# Patient Record
Sex: Female | Born: 2000 | Race: Black or African American | Hispanic: No | Marital: Single | Smoking: Current some day smoker
Health system: Southern US, Community
[De-identification: ages and names within clinical notes are randomized; demographics above are authoritative.]

---
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
## 2022-01-22 DIAGNOSIS — Z419 Encounter for procedure for purposes other than remedying health state, unspecified: Secondary | ICD-10-CM | POA: Diagnosis not present

## 2022-02-22 DIAGNOSIS — Z419 Encounter for procedure for purposes other than remedying health state, unspecified: Secondary | ICD-10-CM | POA: Diagnosis not present

## 2022-03-24 DIAGNOSIS — Z419 Encounter for procedure for purposes other than remedying health state, unspecified: Secondary | ICD-10-CM | POA: Diagnosis not present

## 2022-04-24 DIAGNOSIS — Z419 Encounter for procedure for purposes other than remedying health state, unspecified: Secondary | ICD-10-CM | POA: Diagnosis not present

## 2022-05-21 DIAGNOSIS — L729 Follicular cyst of the skin and subcutaneous tissue, unspecified: Secondary | ICD-10-CM | POA: Diagnosis not present

## 2022-05-21 DIAGNOSIS — H6691 Otitis media, unspecified, right ear: Secondary | ICD-10-CM | POA: Diagnosis not present

## 2022-05-21 DIAGNOSIS — R030 Elevated blood-pressure reading, without diagnosis of hypertension: Secondary | ICD-10-CM | POA: Diagnosis not present

## 2022-05-22 DIAGNOSIS — Z0000 Encounter for general adult medical examination without abnormal findings: Secondary | ICD-10-CM | POA: Diagnosis not present

## 2022-05-22 DIAGNOSIS — N611 Abscess of the breast and nipple: Secondary | ICD-10-CM | POA: Diagnosis not present

## 2022-05-22 DIAGNOSIS — F32A Depression, unspecified: Secondary | ICD-10-CM | POA: Diagnosis not present

## 2022-05-22 DIAGNOSIS — G479 Sleep disorder, unspecified: Secondary | ICD-10-CM | POA: Diagnosis not present

## 2022-05-24 DIAGNOSIS — Z419 Encounter for procedure for purposes other than remedying health state, unspecified: Secondary | ICD-10-CM | POA: Diagnosis not present

## 2022-05-26 DIAGNOSIS — N611 Abscess of the breast and nipple: Secondary | ICD-10-CM | POA: Diagnosis not present

## 2022-05-27 DIAGNOSIS — N611 Abscess of the breast and nipple: Secondary | ICD-10-CM | POA: Diagnosis not present

## 2022-05-30 DIAGNOSIS — Z113 Encounter for screening for infections with a predominantly sexual mode of transmission: Secondary | ICD-10-CM | POA: Diagnosis not present

## 2022-05-30 DIAGNOSIS — N926 Irregular menstruation, unspecified: Secondary | ICD-10-CM | POA: Diagnosis not present

## 2022-05-30 DIAGNOSIS — H539 Unspecified visual disturbance: Secondary | ICD-10-CM | POA: Diagnosis not present

## 2022-05-30 DIAGNOSIS — N611 Abscess of the breast and nipple: Secondary | ICD-10-CM | POA: Diagnosis not present

## 2022-05-30 DIAGNOSIS — Z0101 Encounter for examination of eyes and vision with abnormal findings: Secondary | ICD-10-CM | POA: Diagnosis not present

## 2022-05-30 DIAGNOSIS — Z0000 Encounter for general adult medical examination without abnormal findings: Secondary | ICD-10-CM | POA: Diagnosis not present

## 2022-05-30 DIAGNOSIS — G479 Sleep disorder, unspecified: Secondary | ICD-10-CM | POA: Diagnosis not present

## 2022-05-30 DIAGNOSIS — Z0001 Encounter for general adult medical examination with abnormal findings: Secondary | ICD-10-CM | POA: Diagnosis not present

## 2022-06-01 ENCOUNTER — Emergency Department: Payer: Medicaid Other

## 2022-06-01 ENCOUNTER — Emergency Department
Admission: EM | Admit: 2022-06-01 | Discharge: 2022-06-01 | Disposition: A | Discharge status: Home / Self Care | Payer: Medicaid Other | Attending: Emergency Medicine | Admitting: Emergency Medicine

## 2022-06-01 ENCOUNTER — Other Ambulatory Visit: Payer: Self-pay

## 2022-06-01 DIAGNOSIS — R7309 Other abnormal glucose: Secondary | ICD-10-CM | POA: Insufficient documentation

## 2022-06-01 DIAGNOSIS — F12920 Cannabis use, unspecified with intoxication, uncomplicated: Secondary | ICD-10-CM

## 2022-06-01 DIAGNOSIS — F12120 Cannabis abuse with intoxication, uncomplicated: Secondary | ICD-10-CM | POA: Diagnosis not present

## 2022-06-01 DIAGNOSIS — R442 Other hallucinations: Secondary | ICD-10-CM | POA: Diagnosis not present

## 2022-06-01 DIAGNOSIS — Z743 Need for continuous supervision: Secondary | ICD-10-CM | POA: Diagnosis not present

## 2022-06-01 DIAGNOSIS — T887XXA Unspecified adverse effect of drug or medicament, initial encounter: Secondary | ICD-10-CM | POA: Diagnosis not present

## 2022-06-01 DIAGNOSIS — R000 Tachycardia, unspecified: Secondary | ICD-10-CM | POA: Diagnosis not present

## 2022-06-01 DIAGNOSIS — T50904A Poisoning by unspecified drugs, medicaments and biological substances, undetermined, initial encounter: Secondary | ICD-10-CM | POA: Diagnosis not present

## 2022-06-01 DIAGNOSIS — R0602 Shortness of breath: Secondary | ICD-10-CM | POA: Diagnosis not present

## 2022-06-01 LAB — CBG MONITORING, ED: Glucose-Capillary: 148 mg/dL — ABNORMAL HIGH (ref 70–99)

## 2022-06-01 NOTE — ED Triage Notes (Signed)
Pt arrives via [facility] from home after taking 10mg  THC gummy at 2030 this evening.

## 2022-06-01 NOTE — ED Provider Notes (Signed)
[facility] Provider Note    Event Date/Time   First MD Initiated Contact with Patient 06/01/22 [phone]     (approximate)   History   Ingestion   HPI  Erica Kent is a 21 y.o. female with history of obesity who presents emergency department EMS with complaints of shortness of breath and anxiety after ingesting THC Gummies.  Denies any other coingestions.  No attempt to hurt herself.  No vomiting or diarrhea.  States she has only taken THC Gummies once before.   History provided by patient and EMS.    History reviewed. No pertinent past medical history.  History reviewed. No pertinent surgical history.  MEDICATIONS:  Prior to Admission medications   Medication Sig Start Date End Date Taking? Authorizing Provider  meloxicam (MOBIC) 15 MG tablet Take 1 tablet (15 mg total) by mouth daily. 10/02/21   Cuthriell, Delorise Royals, PA-C  polyethylene glycol (MIRALAX / GLYCOLAX) 17 g packet Take 17 g by mouth daily. Mix one tablespoon with 8oz of your favorite juice or water every day until you are having soft formed stools. Then start taking once daily if you didn't have a stool the day before. 06/25/19   Willy Eddy, MD  SPRINTEC 28 0.25-35 MG-MCG tablet Take 1 tablet by mouth as directed. 03/21/19   [provider]    Physical Exam   Triage Vital Signs: ED Triage Vitals  Enc Vitals Group     BP 06/01/22 0047 (!) 148/80     Pulse Rate 06/01/22 0047 (!) 113     Resp 06/01/22 0047 (!) 28     Temp 06/01/22 0047 98 F (36.7 C)     Temp Source 06/01/22 0047 Oral     SpO2 06/01/22 0047 100 %     Weight 06/01/22 0048 (!) 354 lb 1.6 oz (160.6 kg)     Height 06/01/22 0048 5\' 5"  (1.651 m)     Head Circumference --      Peak Flow --      Pain Score 06/01/22 0048 0     Pain Loc --      Pain Edu? --      Excl. in GC? --     Most recent vital signs: Vitals:   06/01/22 0047 06/01/22 0155  BP: (!) 148/80 (!) 155/73  Pulse: (!) 113 96  Resp: (!) 28 19   Temp: 98 F (36.7 C)   SpO2: 100% 97%    CONSTITUTIONAL: Alert and oriented and responds appropriately to questions.  Obese.  Appears intoxicated.  Rapid speech. HEAD: Normocephalic, atraumatic EYES: Conjunctivae clear, pupils appear equal, sclera nonicteric ENT: normal nose; moist mucous membranes NECK: Supple, normal ROM CARD: Regular and tachycardic; S1 and S2 appreciated; no murmurs, no clicks, no rubs, no gallops RESP: Normal chest excursion without splinting or tachypnea; breath sounds clear and equal bilaterally; no wheezes, no rhonchi, no rales, no hypoxia or respiratory distress, speaking full sentences ABD/GI: Normal bowel sounds; non-distended; soft, non-tender, no rebound, no guarding, no peritoneal signs BACK: The back appears normal EXT: Normal ROM in all joints; no deformity noted, no edema; no cyanosis SKIN: Normal color for age and race; warm; no rash on exposed skin NEURO: Moves all extremities equally, normal speech PSYCH: Rapid speech.  Abnormal thought process.  No SI or HI.   ED Results / Procedures / Treatments   LABS: (all labs ordered are listed, but only abnormal results are displayed) Labs Reviewed  CBG MONITORING, ED - Abnormal;  Notable for the following components:      Result Value   Glucose-Capillary 148 (*)    All other components within normal limits     EKG:  EKG Interpretation  Date/Time:  Sunday June 01 2022 00:56:23 EDT Ventricular Rate:  102 PR Interval:  178 QRS Duration: 74 QT Interval:  352 QTC Calculation: 458 R Axis:   54 Text Interpretation: Sinus tachycardia Nonspecific T wave abnormality Abnormal ECG No previous ECGs available Confirmed by Rochele Raring [phone]) on 06/01/2022 1:41:42 AM         RADIOLOGY: My personal review and interpretation of imaging: Chest x-ray clear.  I have personally reviewed all radiology reports.   DG Chest Portable 1 View  Result Date: 06/01/2022 CLINICAL DATA:  Shortness of breath EXAM:  PORTABLE CHEST 1 VIEW COMPARISON:  None Available. FINDINGS: The heart size and mediastinal contours are within normal limits. Both lungs are hypoinflated. The visualized skeletal structures are unremarkable. IMPRESSION: No active disease. Electronically Signed   By: Alcide Clever M.D.   On: 06/01/2022 01:20     PROCEDURES:  Critical Care performed: No     Procedures    IMPRESSION / MDM / ASSESSMENT AND PLAN / ED COURSE  I reviewed the triage vital signs and the nursing notes.    Patient here with shortness of breath, rapid speech after ingesting THC Gummies.  The patient is on the cardiac monitor to evaluate for evidence of arrhythmia and/or significant heart rate changes.   DIFFERENTIAL DIAGNOSIS (includes but not limited to):   Intoxication, doubt intentional overdose, low suspicion for ACS, PE, dissection   Patient's presentation is most consistent with acute presentation with potential threat to life or bodily function.   PLAN: We will monitor patient.  Will obtain EKG, chest x-ray.  She states she does not have anyone sober that she can go home with at this time.   MEDICATIONS GIVEN IN ED: Medications - No data to display   ED COURSE: Patient's EKG nonischemic.  Chest x-ray reviewed/interpreted by myself radiologist and shows no infiltrate, edema, pneumothorax, pneumomediastinum.  Patient is now sleeping.  She is able to tolerate p.o. and ambulate.  Her mother and aunt are at the bedside and feel comfortable taking her home.  Have advised her to avoid marijuana products in the future.   At this time, I do not feel there is any life-threatening condition present. I reviewed all nursing notes, vitals, pertinent previous records.  All lab and urine results, EKGs, imaging ordered have been independently reviewed and interpreted by myself.  I reviewed all available radiology reports from any imaging ordered this visit.  Based on my assessment, I feel the patient is safe to be  discharged home without further emergent workup and can continue workup as an outpatient as needed. Discussed all findings, treatment plan as well as usual and customary return precautions.  They verbalize understanding and are comfortable with this plan.  Outpatient follow-up has been provided as needed.  All questions have been answered.    CONSULTS: No admission needed at this time.   OUTSIDE RECORDS REVIEWED: Reviewed patient's last pediatric office visit with Bettina Gavia on 05/30/2022.       FINAL CLINICAL IMPRESSION(S) / ED DIAGNOSES   Final diagnoses:  Cannabis intoxication without complication (HCC)     Rx / DC Orders   ED Discharge Orders     None        Note:  This document was prepared using Dragon voice  recognition software and may include unintentional dictation errors.   Jelene Albano, Layla Maw, DO 06/01/22 0201

## 2022-06-24 DIAGNOSIS — Z419 Encounter for procedure for purposes other than remedying health state, unspecified: Secondary | ICD-10-CM | POA: Diagnosis not present

## 2022-07-25 DIAGNOSIS — Z419 Encounter for procedure for purposes other than remedying health state, unspecified: Secondary | ICD-10-CM | POA: Diagnosis not present

## 2022-08-24 DIAGNOSIS — Z419 Encounter for procedure for purposes other than remedying health state, unspecified: Secondary | ICD-10-CM | POA: Diagnosis not present

## 2022-09-24 DIAGNOSIS — Z419 Encounter for procedure for purposes other than remedying health state, unspecified: Secondary | ICD-10-CM | POA: Diagnosis not present

## 2022-10-24 DIAGNOSIS — Z419 Encounter for procedure for purposes other than remedying health state, unspecified: Secondary | ICD-10-CM | POA: Diagnosis not present

## 2022-11-24 DIAGNOSIS — Z419 Encounter for procedure for purposes other than remedying health state, unspecified: Secondary | ICD-10-CM | POA: Diagnosis not present

## 2022-11-26 DIAGNOSIS — L02415 Cutaneous abscess of right lower limb: Secondary | ICD-10-CM | POA: Diagnosis not present

## 2022-12-25 DIAGNOSIS — Z419 Encounter for procedure for purposes other than remedying health state, unspecified: Secondary | ICD-10-CM | POA: Diagnosis not present

## 2022-12-29 IMAGING — CR DG KNEE COMPLETE 4+V*L*
4 series · 4 of 4 positions shown · non-contrast
Comparison: None.

CLINICAL DATA: Knee pain after a fall.

EXAM:
LEFT KNEE - COMPLETE 4+ VIEW

[knee ap]
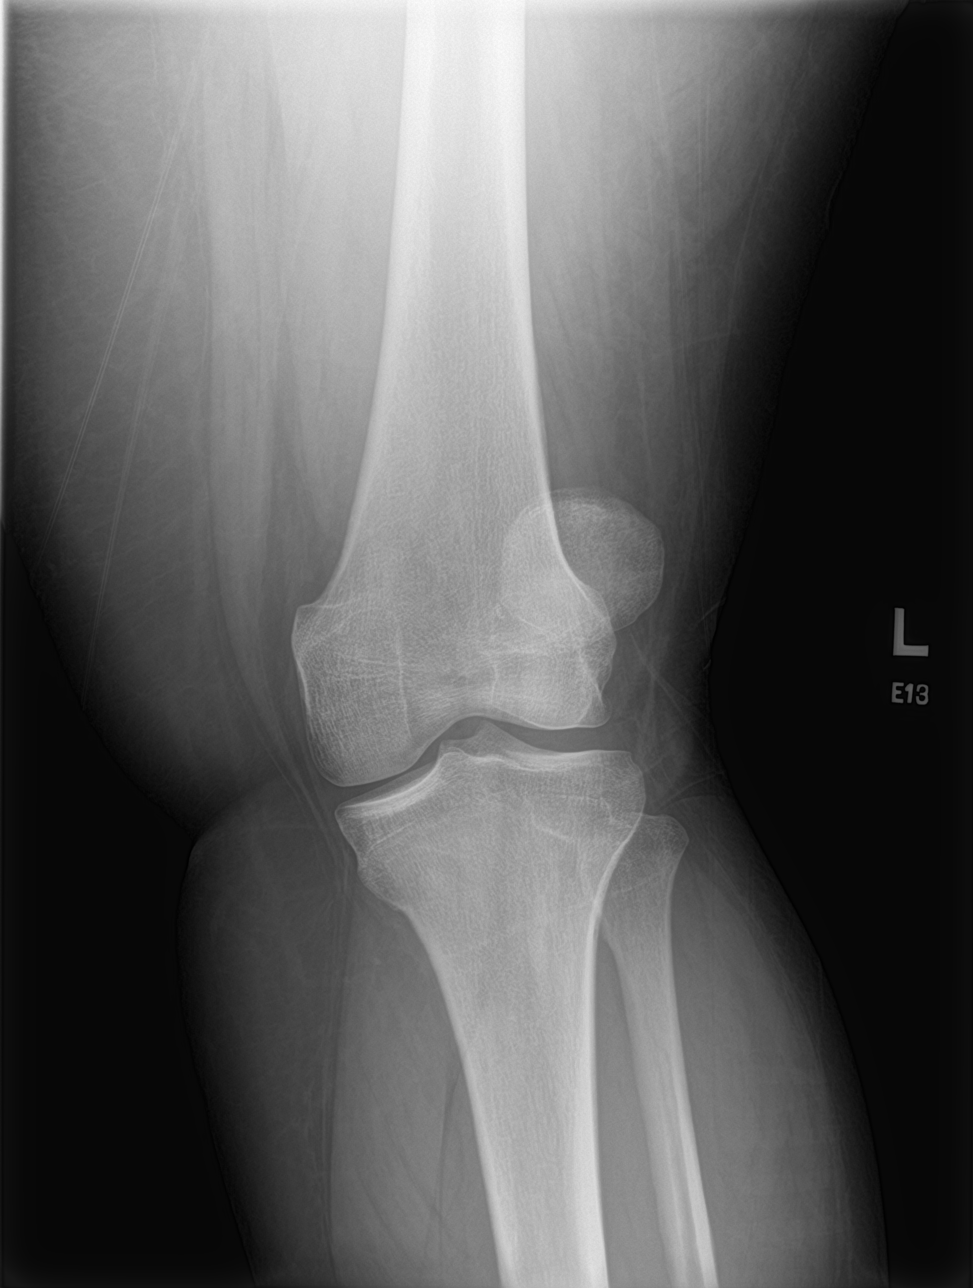

[knee obl (1 of 2)]
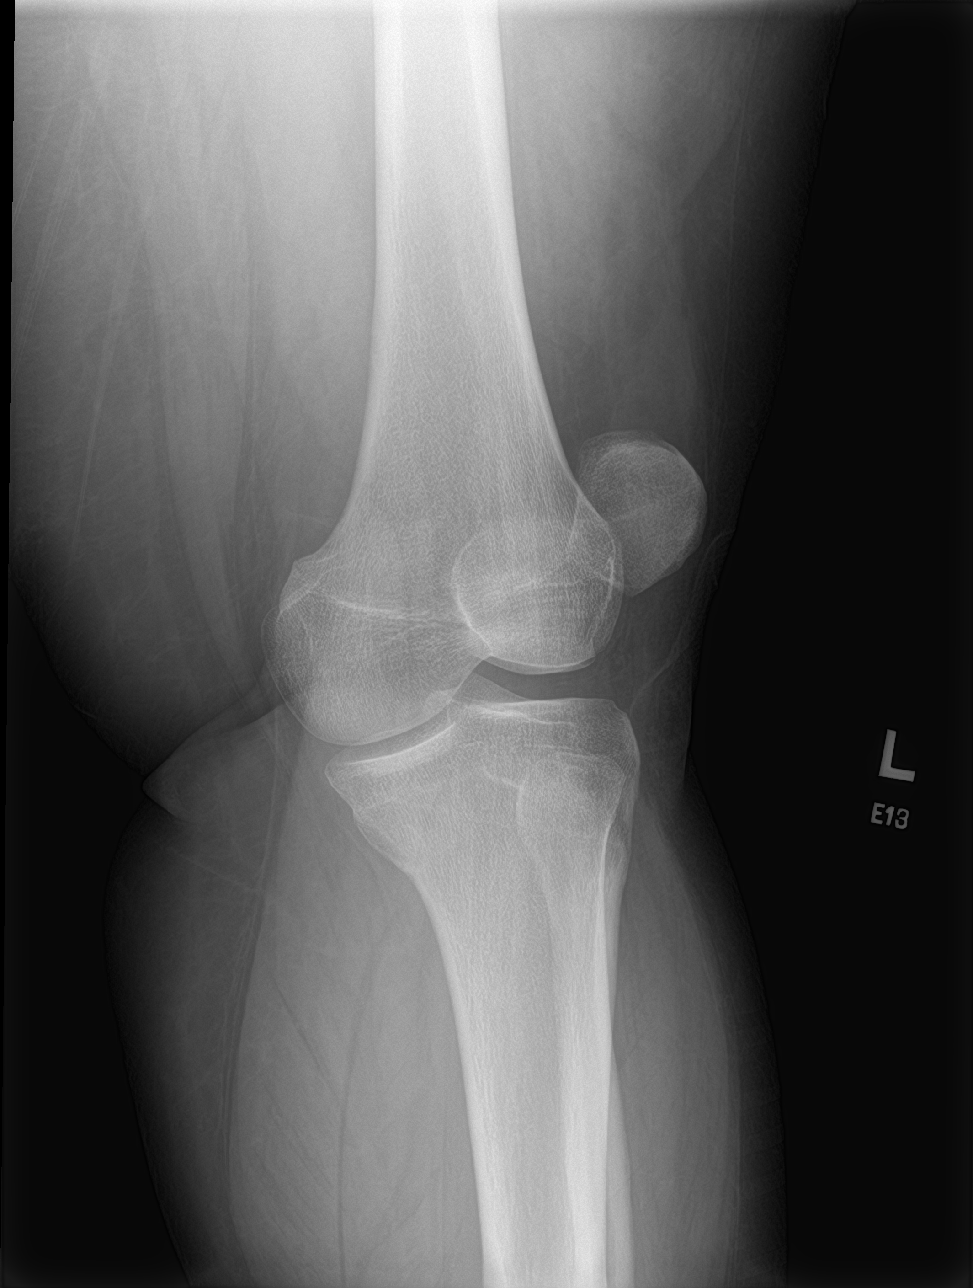

[knee obl (2 of 2)]
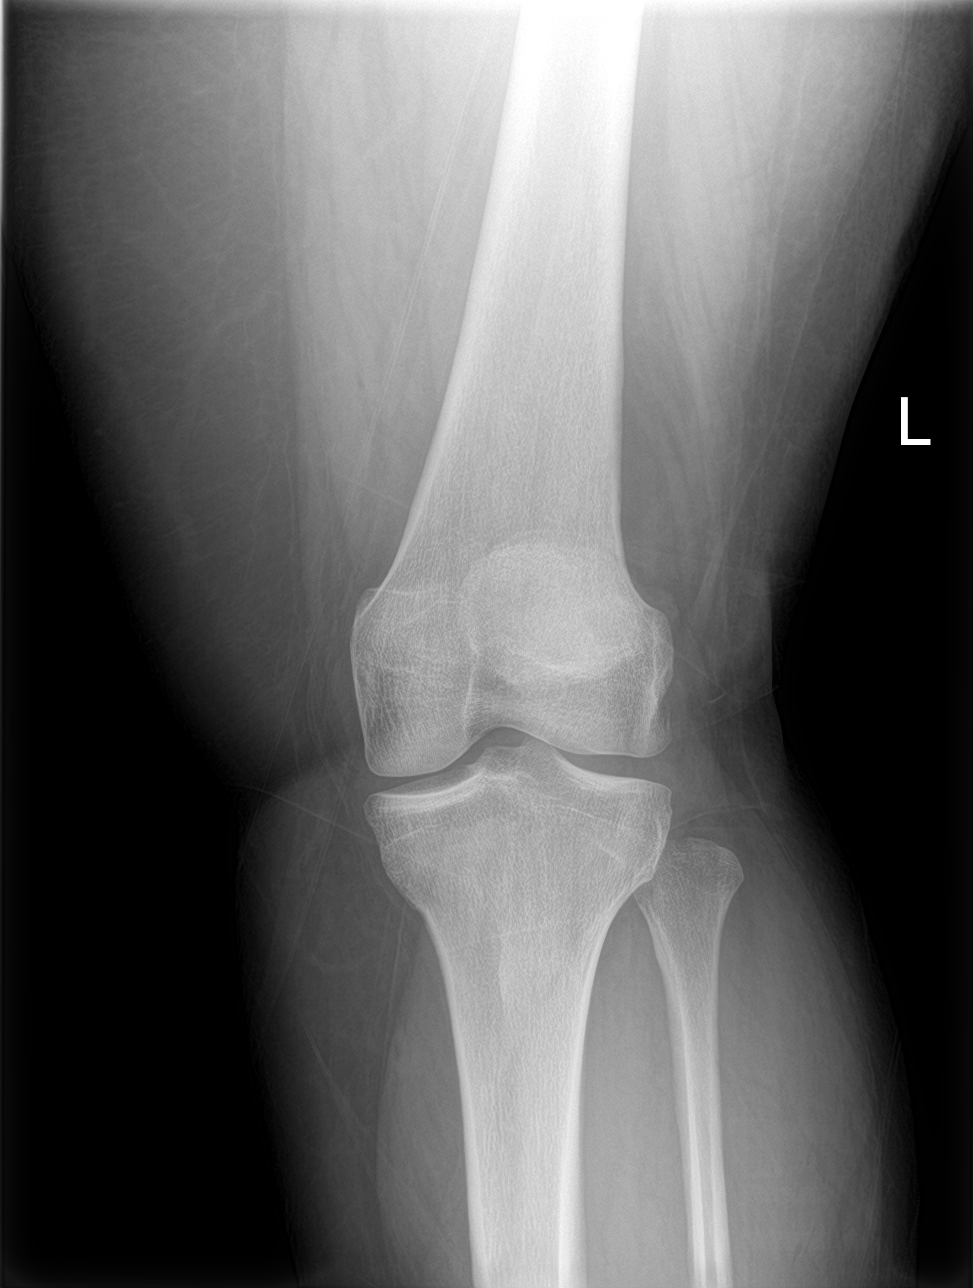

[knee lat]
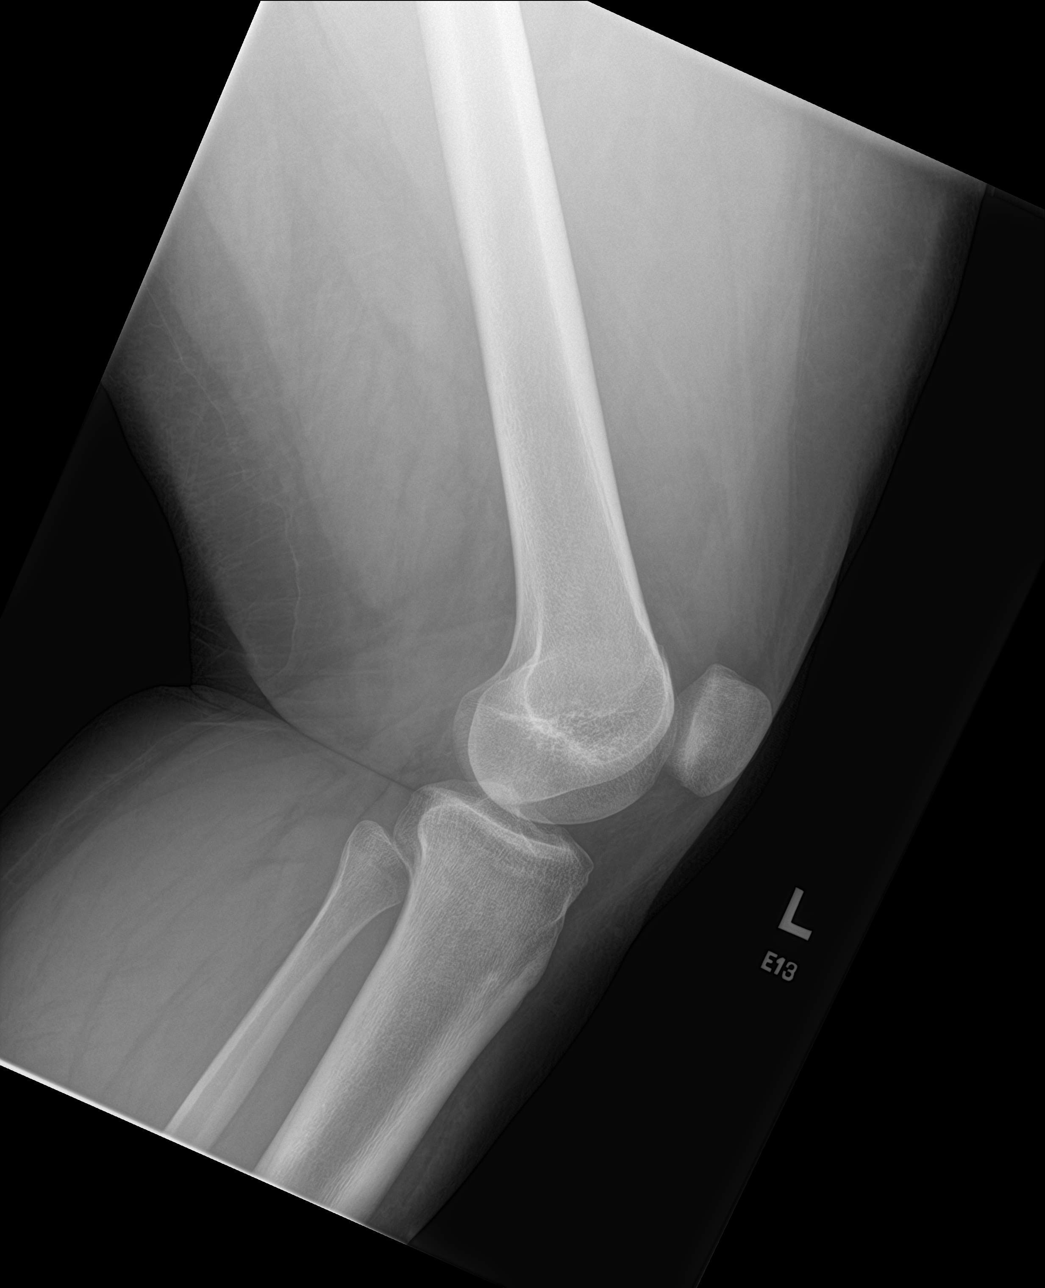

[4 of 4 positions shown; findings below may reference images not displayed]

FINDINGS: No evidence of fracture, dislocation, or joint effusion. No evidence
of arthropathy or other focal bone abnormality. Soft tissues are
unremarkable.
IMPRESSION: Normal radiographs.

## 2023-01-23 DIAGNOSIS — Z419 Encounter for procedure for purposes other than remedying health state, unspecified: Secondary | ICD-10-CM | POA: Diagnosis not present

## 2024-07-28 ENCOUNTER — Emergency Department
Admission: EM | Admit: 2024-07-28 | Discharge: 2024-07-28 | Disposition: A | Discharge status: Home / Self Care | Attending: Emergency Medicine | Admitting: Emergency Medicine

## 2024-07-28 ENCOUNTER — Other Ambulatory Visit: Payer: Self-pay

## 2024-07-28 DIAGNOSIS — H6691 Otitis media, unspecified, right ear: Secondary | ICD-10-CM | POA: Diagnosis not present

## 2024-07-28 DIAGNOSIS — H9201 Otalgia, right ear: Secondary | ICD-10-CM | POA: Diagnosis present

## 2024-07-28 MED ORDER — IBUPROFEN 600 MG PO TABS
600.0000 mg | ORAL_TABLET | Freq: Once | ORAL | Status: AC
  Administered 2024-07-28: 600 mg via ORAL
  Filled 2024-07-28: qty 1

## 2024-07-28 MED ORDER — AMOXICILLIN-POT CLAVULANATE 875-125 MG PO TABS: 1.0000 | ORAL_TABLET | Freq: Two times a day (BID) | ORAL | 0 refills | Status: AC

## 2024-07-28 MED ORDER — AMOXICILLIN-POT CLAVULANATE 875-125 MG PO TABS
1.0000 | ORAL_TABLET | Freq: Once | ORAL | Status: AC
  Administered 2024-07-28: 1 via ORAL
  Filled 2024-07-28: qty 1

## 2024-07-28 NOTE — ED Provider Notes (Signed)
 [facility] Provider Note    Event Date/Time   First MD Initiated Contact with Patient 07/28/24 1800     (approximate)   History   Otalgia   HPI  Erica Kent is a 23 y.o. female  with no significant past medical history presents to the emergency department with right otalgia that started last night.  States she has been putting Q-tips in the ears to clean them out.  Denies getting into a deep body of water recently or recent flight.  Denies fever, chills, vision changes, nasal pain, shortness of breath, chest pain, dental pain, sore throat.  Denies any drainage from her ear, fall or injury.    Physical Exam   Triage Vital Signs: ED Triage Vitals [07/28/24 1635]  Encounter Vitals Group     BP (!) 148/84     Girls Systolic BP Percentile      Girls Diastolic BP Percentile      Boys Systolic BP Percentile      Boys Diastolic BP Percentile      Pulse Rate 100     Resp 20     Temp 98.4 F (36.9 C)     Temp Source Oral     SpO2 100 %     Weight      Height      Head Circumference      Peak Flow      Pain Score 9     Pain Loc      Pain Education      Exclude from Growth Chart     Most recent vital signs: Vitals:   07/28/24 1635 07/28/24 1812  BP: (!) 148/84   Pulse: 100   Resp: 20   Temp: 98.4 F (36.9 C)   SpO2: 100% 100%    General: Awake, in no acute distress. Appears stated age. Head: Normocephalic, atraumatic. Eyes: No scleral icterus or conjunctival injection. Ears/Nose/Throat: Right TM erythematous and bulging. No cerumen impaction, debris on outer ear, or evidence of perforation. No foreign body to the ear. Nares patent, no nasal discharge. Oropharynx moist, no erythema or exudate. Dentition intact. No asymmetric facial swelling. No trismus. Neck: Supple, no lymphadenopathy, no nuchal rigidity. CV: Good peripheral perfusion.  Respiratory:Normal respiratory effort.  No respiratory distress.  GI: Soft, non-distended. Skin:Warm,  dry, intact. No rashes, lesions, or ecchymosis. No cyanosis or pallor.   ED Results / Procedures / Treatments   Labs (all labs ordered are listed, but only abnormal results are displayed) Labs Reviewed - No data to display   EKG     RADIOLOGY    PROCEDURES:  Critical Care performed: No   Procedures   MEDICATIONS ORDERED IN ED: Medications  ibuprofen  (ADVIL ) tablet 600 mg (has no administration in time range)  amoxicillin -clavulanate (AUGMENTIN ) 875-125 MG per tablet 1 tablet (1 tablet Oral Given 07/28/24 1833)     IMPRESSION / MDM / ASSESSMENT AND PLAN / ED COURSE  I reviewed the triage vital signs and the nursing notes.                              Differential diagnosis includes, but is not limited to, acute otitis media, foreign body of ear, cerumen impaction  Patient's presentation is most consistent with acute, uncomplicated illness.  Patient is a 23 year old female presenting with acute right otalgia after sticking of Q-tips in her ears.  No trismus, no sore throat, no drainage from  the ear, no fever.  Patient's ears without foreign body or cerumen.  Tympanic membrane appears bulging and erythematous.  Will treat her for otitis media with Augmentin , 1 dose provided here and will send the remainder to her pharmacy.  Her blood pressure is also elevated today; did write in her discharge paperwork a referral to primary care with resources listed so she can follow-up and establish care for recheck of her blood pressure.  The patient may return to the emergency department for any new, worsening, or concerning symptoms. Patient was given the opportunity to ask questions; all questions were answered. Emergency department return precautions were discussed with the patient.  Patient is in agreement to the treatment plan.  Patient is stable for discharge.   FINAL CLINICAL IMPRESSION(S) / ED DIAGNOSES   Final diagnoses:  Right otitis media, unspecified otitis media type      Rx / DC Orders   ED Discharge Orders          Ordered    amoxicillin -clavulanate (AUGMENTIN ) 875-125 MG tablet  2 times daily        07/28/24 1817    Ambulatory Referral to Primary Care (Establish Care)        07/28/24 1817             Note:  This document was prepared using Dragon voice recognition software and may include unintentional dictation errors.     Sheron Salm, PA-C 07/28/24 1844    Waymond Lorelle Cummins, MD 07/28/24 [phone]

## 2024-07-28 NOTE — Discharge Instructions (Addendum)
 You were seen in the emergency department for an ear infection. Please pick up and take antibiotics as prescribed. Please do not place anything in your ear. You may take ibuprofen  based on the dosing on the bottle as needed to help with your pain. Please do not take this medication if you become pregnant.   Your blood pressure is elevated today.  I have placed a referral to establish care with primary care offices; please read to the resources attached and give one of them a call at your earliest convenience to establish ongoing care and have them recheck your blood pressure to see if you need to be started on high blood pressure medication.  Please return the emergency department for any new, worsening or concerning symptoms.

## 2024-07-28 NOTE — ED Triage Notes (Signed)
 Patient states right ear pain since last night

## 2024-08-02 ENCOUNTER — Emergency Department
Admission: EM | Admit: 2024-08-02 | Discharge: 2024-08-02 | Disposition: A | Discharge status: Home / Self Care | Attending: Emergency Medicine | Admitting: Emergency Medicine

## 2024-08-02 ENCOUNTER — Other Ambulatory Visit: Payer: Self-pay

## 2024-08-02 DIAGNOSIS — H9202 Otalgia, left ear: Secondary | ICD-10-CM | POA: Diagnosis present

## 2024-08-02 DIAGNOSIS — H6122 Impacted cerumen, left ear: Secondary | ICD-10-CM | POA: Insufficient documentation

## 2024-08-02 MED ORDER — IBUPROFEN 800 MG PO TABS
800.0000 mg | ORAL_TABLET | Freq: Once | ORAL | Status: AC
  Administered 2024-08-02: 800 mg via ORAL
  Filled 2024-08-02: qty 1

## 2024-08-02 MED ORDER — CARBAMIDE PEROXIDE 6.5 % OT SOLN
10.0000 [drp] | Freq: Once | OTIC | Status: AC
  Administered 2024-08-02: 10 [drp] via OTIC
  Filled 2024-08-02: qty 15

## 2024-08-02 MED ORDER — DOCUSATE SODIUM 50 MG/5ML PO LIQD
10.0000 mg | Freq: Once | ORAL | Status: AC
  Administered 2024-08-02: 10 mg via OTIC
  Filled 2024-08-02: qty 10

## 2024-08-02 NOTE — ED Notes (Signed)
 Pts left ear irrigated by this RN. Pt reports that she feels relief in ear now. Denies pain at this time.

## 2024-08-02 NOTE — ED Provider Notes (Signed)
 [facility] Provider Note    Event Date/Time   First MD Initiated Contact with Patient 08/02/24 [phone]     (approximate)   History   Otalgia   HPI  Erica Kent is a 23 y.o. female with history of prediabetes, obesity who presents to the emergency department complaints of left ear pain.  States she was recently seen for right ear pain and has been on antibiotics for an ear infection.  Feels like the right ear is improving but the left ear started hurting tonight.  She has been taking Tylenol without relief.  No fevers, sore throat, cough, congestion.   History provided by patient.    History reviewed. No pertinent past medical history.  History reviewed. No pertinent surgical history.  MEDICATIONS:  Prior to Admission medications   Medication Sig Start Date End Date Taking? Authorizing Provider  amoxicillin -clavulanate (AUGMENTIN ) 875-125 MG tablet Take 1 tablet by mouth 2 (two) times daily for 7 days. 07/28/24 08/04/24  Dunlap, Summer, PA-C  meloxicam  (MOBIC ) 15 MG tablet Take 1 tablet (15 mg total) by mouth daily. 10/02/21   Cuthriell, Jonathan D, PA-C  polyethylene glycol (MIRALAX  / GLYCOLAX ) 17 g packet Take 17 g by mouth daily. Mix one tablespoon with 8oz of your favorite juice or water every day until you are having soft formed stools. Then start taking once daily if you didn't have a stool the day before. 06/25/19   Lang Dover, MD  SPRINTEC 28 0.25-35 MG-MCG tablet Take 1 tablet by mouth as directed. 03/21/19   [provider]    Physical Exam   Triage Vital Signs: ED Triage Vitals  Encounter Vitals Group     BP 08/02/24 0056 (!) 122/107     Girls Systolic BP Percentile --      Girls Diastolic BP Percentile --      Boys Systolic BP Percentile --      Boys Diastolic BP Percentile --      Pulse Rate 08/02/24 0056 96     Resp 08/02/24 0056 20     Temp 08/02/24 0056 97.7 F (36.5 C)     Temp Source 08/02/24 0056 Oral     SpO2  08/02/24 0056 100 %     Weight 08/02/24 0055 (!) 375 lb (170.1 kg)     Height 08/02/24 0055 5' 7 (1.702 m)     Head Circumference --      Peak Flow --      Pain Score 08/02/24 0055 10     Pain Loc --      Pain Education --      Exclude from Growth Chart --     Most recent vital signs: Vitals:   08/02/24 0056 08/02/24 0649  BP: (!) 122/107 119/67  Pulse: 96 98  Resp: 20 18  Temp: 97.7 F (36.5 C) 99.3 F (37.4 C)  SpO2: 100% 100%     CONSTITUTIONAL: Alert and responds appropriately to questions. Well-appearing; well-nourished HEAD: Normocephalic, atraumatic EYES: Conjunctivae clear, pupils appear equal ENT: normal nose; moist mucous membranes, right TM appears normal.  No cerumen impaction on the right.  Left external auditory canal is completely impacted with cerumen and obscures the TM.  No drainage, bleeding.  No pain with manipulation of the right pinna but does have some pain with manipulation of the left.  No signs clinically of mastoiditis.  No pharyngeal erythema or petechiae, no tonsillar hypertrophy or exudate, no uvular deviation, no unilateral swelling in posterior oropharynx,  no trismus or drooling, no muffled voice, normal phonation, no stridor, airway patent. NECK: Normal range of motion CARD: Regular rate and rhythm RESP: Normal chest excursion without splinting or tachypnea; no hypoxia or respiratory distress, speaking full sentences ABD/GI: non-distended EXT: Normal ROM in all joints, no major deformities noted SKIN: Normal color for age and race, no rashes on exposed skin NEURO: Moves all extremities equally, normal speech, no facial asymmetry noted PSYCH: The patient's mood and manner are appropriate. Grooming and personal hygiene are appropriate.  ED Results / Procedures / Treatments   LABS: (all labs ordered are listed, but only abnormal results are displayed) Labs Reviewed - No data to display   EKG:   RADIOLOGY: My personal review and  interpretation of imaging:    I have personally reviewed all radiology reports. No results found.   PROCEDURES:  Critical Care performed: No     Ear Cerumen Removal  Date/Time: 08/02/2024 6:30 AM  Performed by: Lorenso Ileana SAUNDERS, RN Authorized by: Laruen Risser, Josette SAILOR, DO   Consent:    Consent obtained:  Verbal   Consent given by:  Patient   Risks, benefits, and alternatives were discussed: yes     Risks discussed:  Bleeding, dizziness, incomplete removal, infection, pain and TM perforation   Alternatives discussed:  Alternative treatment, delayed treatment and referral Universal protocol:    Procedure explained and questions answered to patient or proxy's satisfaction: yes     Relevant documents present and verified: yes     Required blood products, implants, devices, and special equipment available: yes     Site/side marked: yes     Immediately prior to procedure, a time out was called: yes     Patient identity confirmed:  Verbally with patient Procedure details:    Location:  L ear   Procedure type: irrigation     Procedure outcomes: cerumen removed   Post-procedure details:    Inspection:  Ear canal clear   Hearing quality:  Improved   Procedure completion:  Tolerated well, no immediate complications     IMPRESSION / MDM / ASSESSMENT AND PLAN / ED COURSE  I reviewed the triage vital signs and the nursing notes.   Patient here with left ear pain likely due to cerumen impaction.     DIFFERENTIAL DIAGNOSIS (includes but not limited to):   Cerumen impaction, otitis media, otitis externa, doubt mastoiditis, no sign of dental infection, tonsillitis, PTA, deep space neck infection, facial cellulitis  Patient's presentation is most consistent with acute complicated illness / injury requiring diagnostic workup.  PLAN: Place Debrox and liquid Colace in patient's ear and irrigated it.  All cerumen completely removed and I am able to visualize her left TM which appears  normal.  No perforation or sign of otitis media or otitis externa.  She is feeling better.  Recommended she continue her antibiotics as prescribed for her right ear as they appear to be working well because her right ear pain has dissipated and the TM appears normal.  I recommended Tylenol, Motrin  as needed.  I feel she is safe for discharge.   MEDICATIONS GIVEN IN ED: Medications  ibuprofen  (ADVIL ) tablet 800 mg (800 mg Oral Given 08/02/24 0543)  carbamide peroxide (DEBROX) 6.5 % OTIC (EAR) solution 10 drop (10 drops Left EAR Given 08/02/24 0544)  docusate (COLACE) 50 MG/5ML liquid 10 mg (10 mg Left EAR Given 08/02/24 0544)     ED COURSE:  At this time, I do not feel there is any  life-threatening condition present. I reviewed all nursing notes, vitals, pertinent previous records.  All lab and urine results, EKGs, imaging ordered have been independently reviewed and interpreted by myself.  I reviewed all available radiology reports from any imaging ordered this visit.  Based on my assessment, I feel the patient is safe to be discharged home without further emergent workup and can continue workup as an outpatient as needed. Discussed all findings, treatment plan as well as usual and customary return precautions.  They verbalize understanding and are comfortable with this plan.  Outpatient follow-up has been provided as needed.  All questions have been answered.    CONSULTS:  none   OUTSIDE RECORDS REVIEWED: Reviewed last pediatric notes in 2023.     FINAL CLINICAL IMPRESSION(S) / ED DIAGNOSES   Final diagnoses:  Impacted cerumen of left ear     Rx / DC Orders   ED Discharge Orders     None        Note:  This document was prepared using Dragon voice recognition software and may include unintentional dictation errors.   Axl Rodino, Josette SAILOR, DO 08/02/24 [phone]

## 2024-08-02 NOTE — ED Triage Notes (Signed)
 Pt reports left ear pain since last night, pt was recently seen here for ear infection on 9/4 and dx with right ear infection. Pt has been taking abx.

## 2024-08-02 NOTE — Discharge Instructions (Signed)
 You may alternate over the counter Tylenol 1000 mg every 6 hours as needed for pain, fever and Ibuprofen 800 mg every 6-8 hours as needed for pain, fever.  Please take Ibuprofen with food.  Do not take more than 4000 mg of Tylenol (acetaminophen) in a 24 hour period.
